# Patient Record
Sex: Female | Born: 1935 | Race: White | Hispanic: No | Marital: Single | Smoking: Never smoker
Health system: Southern US, Community
[De-identification: ages and names within clinical notes are randomized; demographics above are authoritative.]

## PROBLEM LIST (undated history)

## (undated) DIAGNOSIS — L0390 Cellulitis, unspecified: Secondary | ICD-10-CM

## (undated) DIAGNOSIS — I10 Essential (primary) hypertension: Secondary | ICD-10-CM

## (undated) DIAGNOSIS — E119 Type 2 diabetes mellitus without complications: Secondary | ICD-10-CM

## (undated) DIAGNOSIS — E7800 Pure hypercholesterolemia, unspecified: Secondary | ICD-10-CM

## (undated) HISTORY — PX: TONSILLECTOMY: SUR1361

---

## 2022-02-08 ENCOUNTER — Encounter (HOSPITAL_BASED_OUTPATIENT_CLINIC_OR_DEPARTMENT_OTHER): Payer: Self-pay | Admitting: Emergency Medicine

## 2022-02-08 ENCOUNTER — Other Ambulatory Visit: Payer: Self-pay

## 2022-02-08 ENCOUNTER — Emergency Department (HOSPITAL_BASED_OUTPATIENT_CLINIC_OR_DEPARTMENT_OTHER)
Admission: EM | Admit: 2022-02-08 | Discharge: 2022-02-08 | Disposition: A | Discharge status: Home / Self Care | Payer: Medicare Other | Attending: Emergency Medicine | Admitting: Emergency Medicine

## 2022-02-08 ENCOUNTER — Emergency Department (HOSPITAL_BASED_OUTPATIENT_CLINIC_OR_DEPARTMENT_OTHER): Payer: Medicare Other

## 2022-02-08 DIAGNOSIS — S32010A Wedge compression fracture of first lumbar vertebra, initial encounter for closed fracture: Secondary | ICD-10-CM

## 2022-02-08 DIAGNOSIS — W109XXA Fall (on) (from) unspecified stairs and steps, initial encounter: Secondary | ICD-10-CM | POA: Diagnosis not present

## 2022-02-08 DIAGNOSIS — Z79899 Other long term (current) drug therapy: Secondary | ICD-10-CM | POA: Insufficient documentation

## 2022-02-08 DIAGNOSIS — S3992XA Unspecified injury of lower back, initial encounter: Secondary | ICD-10-CM | POA: Diagnosis present

## 2022-02-08 DIAGNOSIS — Z7982 Long term (current) use of aspirin: Secondary | ICD-10-CM | POA: Insufficient documentation

## 2022-02-08 HISTORY — DX: Pure hypercholesterolemia, unspecified: E78.00

## 2022-02-08 HISTORY — DX: Essential (primary) hypertension: I10

## 2022-02-08 HISTORY — DX: Type 2 diabetes mellitus without complications: E11.9

## 2022-02-08 HISTORY — DX: Cellulitis, unspecified: L03.90

## 2022-02-08 MED ORDER — LIDOCAINE 5 % EX PTCH
1.0000 | MEDICATED_PATCH | CUTANEOUS | Status: DC
  Administered 2022-02-08: 1 via TRANSDERMAL
  Filled 2022-02-08: qty 1

## 2022-02-08 MED ORDER — OXYCODONE HCL 5 MG PO TABS: 5.0000 mg | ORAL_TABLET | Freq: Four times a day (QID) | ORAL | 0 refills | Status: AC | PRN

## 2022-02-08 MED ORDER — IBUPROFEN 800 MG PO TABS
800.0000 mg | ORAL_TABLET | Freq: Once | ORAL | Status: AC
  Administered 2022-02-08: 800 mg via ORAL
  Filled 2022-02-08: qty 1

## 2022-02-08 MED ORDER — IBUPROFEN 600 MG PO TABS: 600.0000 mg | ORAL_TABLET | Freq: Four times a day (QID) | ORAL | 0 refills | Status: AC | PRN

## 2022-02-08 MED ORDER — FENTANYL CITRATE PF 50 MCG/ML IJ SOSY
50.0000 ug | PREFILLED_SYRINGE | Freq: Once | INTRAMUSCULAR | Status: DC
  Filled 2022-02-08: qty 1

## 2022-02-08 NOTE — ED Provider Notes (Signed)
?[facility] ?Provider Note ? ? ?CSN: [id number] ?Arrival date & time: 02/08/22  1244 ? ?  ? ?History ? ?Chief Complaint  ?Patient presents with  ? Fall  ? ? ?Heather Burch is a 88 y.o. female. ? ?Patient is a 88 yo female presenting for back pain after fall. Pt states she was walking down steps when she slipped and fell 2 steps 2 days ago, on Saturday 01/06/22. Admits to head trauma, no loc, no blood thinner use. States she had immediate lower midline back pain that she thought would get better over time. States pain is not improving with home motrin use. Denies any bowel or urinary incontinence. Denies any sensation or motor deficits.  ? ?The history is provided by the patient and a friend. No language interpreter was used.  ?Fall ?Pertinent negatives include no chest pain, no abdominal pain and no shortness of breath.  ? ?  ? ?Home Medications ?Prior to Admission medications   ?Medication Sig Start Date End Date Taking? Authorizing Provider  ?amLODipine (NORVASC) 5 MG tablet Take 5 mg by mouth daily.   Yes [provider]  ?aspirin 81 MG chewable tablet Chew by mouth daily.   Yes [provider]  ?losartan (COZAAR) 100 MG tablet Take 50 mg by mouth daily.   Yes [provider]  ?metFORMIN (GLUCOPHAGE) 500 MG tablet Take by mouth 2 (two) times daily with a meal.   Yes [provider]  ?simvastatin (ZOCOR) 10 MG tablet Take 10 mg by mouth daily.   Yes [provider]  ?   ? ?Allergies    ?Patient has no allergy information on record.   ? ?Review of Systems   ?Review of Systems  ?Constitutional:  Negative for chills and fever.  ?HENT:  Negative for ear pain and sore throat.   ?Eyes:  Negative for pain and visual disturbance.  ?Respiratory:  Negative for cough and shortness of breath.   ?Cardiovascular:  Negative for chest pain and palpitations.  ?Gastrointestinal:  Negative for abdominal pain and vomiting.  ?Genitourinary:  Negative for dysuria  and hematuria.  ?Musculoskeletal:  Positive for back pain. Negative for arthralgias.  ?Skin:  Negative for color change and rash.  ?Neurological:  Negative for seizures and syncope.  ?All other systems reviewed and are negative. ? ?Physical Exam ?Updated Vital Signs ?BP (!) 168/68 (BP Location: Left Arm)   Pulse 81   Temp 98.1 ?F (36.7 ?C) (Oral)   Resp 18   Ht 5\' 6"  (1.676 m)   Wt 77.1 kg   SpO2 96%   BMI 27.44 kg/m?  ?Physical Exam ?Vitals and nursing note reviewed.  ?Constitutional:   ?   General: She is not in acute distress. ?   Appearance: She is well-developed.  ?HENT:  ?   Head: Normocephalic and atraumatic.  ?Eyes:  ?   Conjunctiva/sclera: Conjunctivae normal.  ?Cardiovascular:  ?   Rate and Rhythm: Normal rate and regular rhythm.  ?   Heart sounds: No murmur heard. ?Pulmonary:  ?   Effort: Pulmonary effort is normal. No respiratory distress.  ?   Breath sounds: Normal breath sounds.  ?Abdominal:  ?   Palpations: Abdomen is soft.  ?   Tenderness: There is no abdominal tenderness.  ?Musculoskeletal:     ?   General: No swelling.  ?   Cervical back: Neck supple. No bony tenderness.  ?   Thoracic back: No bony tenderness.  ?   Lumbar back: Tenderness and  bony tenderness present.  ?Skin: ?   General: Skin is warm and dry.  ?   Capillary Refill: Capillary refill takes less than 2 seconds.  ?Neurological:  ?   Mental Status: She is alert.  ?   GCS: GCS eye subscore is 4. GCS verbal subscore is 5. GCS motor subscore is 6.  ?   Sensory: Sensation is intact.  ?   Motor: Motor function is intact.  ?Psychiatric:     ?   Mood and Affect: Mood normal.  ? ? ?ED Results / Procedures / Treatments   ?Labs ?(all labs ordered are listed, but only abnormal results are displayed) ?Labs Reviewed - No data to display ? ?EKG ?None ? ?Radiology ?CT Lumbar Spine Wo Contrast ? ?Result Date: 02/08/2022 ?CLINICAL DATA:  Back trauma, patient states she fell down the steps today, approximately 5-6-. EXAM: CT LUMBAR SPINE WITHOUT  CONTRAST TECHNIQUE: Multidetector CT imaging of the lumbar spine was performed without intravenous contrast administration. Multiplanar CT image reconstructions were also generated. RADIATION DOSE REDUCTION: This exam was performed according to the departmental dose-optimization program which includes automated exposure control, adjustment of the mA and/or kV according to patient size and/or use of iterative reconstruction technique. COMPARISON:  None. FINDINGS: Segmentation: 5 lumbar type vertebrae. Alignment: Grade 1 retrolisthesis of L3 and anterolisthesis of L4. Vertebrae: Superior endplate compression deformity of the L1 vertebral body with approximately 10% anterior vertebral body height loss. Paraspinal and other soft tissues: Negative. Disc levels: T12-L1: No significant spinal canal or neural foraminal stenosis. L1-L2: No significant spinal canal or neural foraminal stenosis. L2-L3: Disc height loss and vacuum disc phenomena. Disc osteophyte complex with moderate narrowing of spinal canal. Narrowing of bilateral lateral recesses. No significant neural foraminal stenosis. L3-L4: Disc osteophyte complex with moderate spinal canal stenosis. Moderate bilateral neural foraminal stenosis, right greater than the left. L4-L5: Disc protrusion with moderate narrowing of spinal canal. Moderate facet joint arthropathy. No significant neural foraminal stenosis. L5-S1: Disc osteophyte complex with narrowing of spinal canal. Moderate facet joint arthropathy. No significant neural foraminal stenosis. IMPRESSION: 1. Superior endplate compression deformity of L1 vertebral body with approximately 10% anterior vertebral body height loss, likely an acute process. 2. Straightening of the lumbar spine with retrolisthesis of L3 and anterolisthesis of L4 with associated facet joint arthropathy. 3.  Advanced multilevel degenerative disc disease as detailed above. Electronically Signed   By: Larose Hires D.O.   On: 02/08/2022 16:27    ? ?Procedures ?Procedures  ? ? ?Medications Ordered in ED ?Medications - No data to display ? ?ED Course/ Medical Decision Making/ A&P ?  ?                        ?Medical Decision Making ?Amount and/or Complexity of Data Reviewed ?Radiology: ordered. ? ?Risk ?Prescription drug management. ? ? ?4:35 PM ?88 yo female presenting for back pain after fall 2 days ago.  She is alert and oriented x3, no acute distress, afebrile, stable vital signs.  Patient neurovascularly intact.  CT lumbar spine demonstrates L1 compression fracture with greater than 10% height loss.  Patient given medication for pain control and close follow-up with specialist. ? ?Patient in no distress and overall condition improved here in the ED. Detailed discussions were had with the patient regarding current findings, and need for close f/u with PCP or on call doctor. The patient has been instructed to return immediately if the symptoms worsen in any way for re-evaluation. Patient  verbalized understanding and is in agreement with current care plan. All questions answered prior to discharge. ? ? ? ? ? ? ? ? ?Final Clinical Impression(s) / ED Diagnoses ?Final diagnoses:  ?Compression fracture of L1 vertebra, initial encounter (HCC)  ? ? ?Rx / DC Orders ?ED Discharge Orders   ? ? None  ? ?  ? ? ?  ?Franne FortsGray, Ski Polich P, DO ?02/08/22 1645 ? ?

## 2022-02-08 NOTE — ED Triage Notes (Addendum)
Pt fell down 1-2 stairs on Saturday and c/o midline lower back pain since.  Pt reports she also hit her head and feels a slight bump on her posterior skull - denies any headache, n/v, dizziness or other symptoms at this time. ?

## 2022-02-08 NOTE — Discharge Instructions (Signed)
Office is closed at this time. Please call first thing tomorrow morning to establish an appointment.  ?

## 2023-04-24 IMAGING — CT CT L SPINE W/O CM
3 of 4 series · 13 of 33 positions shown, 14 images · non-contrast
Comparison: None.

CLINICAL DATA: Back trauma, patient states she fell down the steps
today, approximately 5-6-.



[Series 4: l-spine wo soft tissue · axial · 0.34mm/px · z∈[+820,+1054]mm · 7 of 157 slices shown]
[im 20/157  soft-tissue]
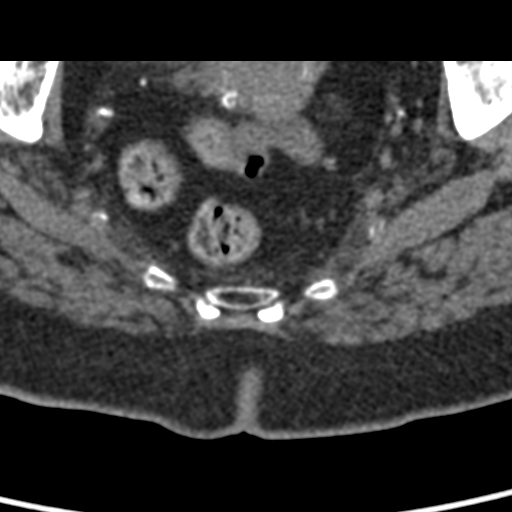
[im 40/157  soft-tissue]
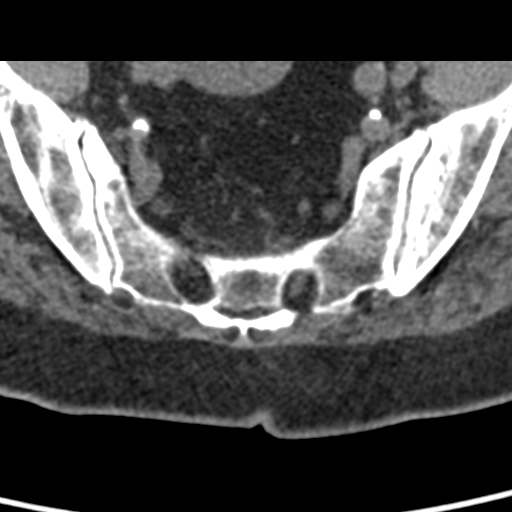
[im 59/157  soft-tissue]
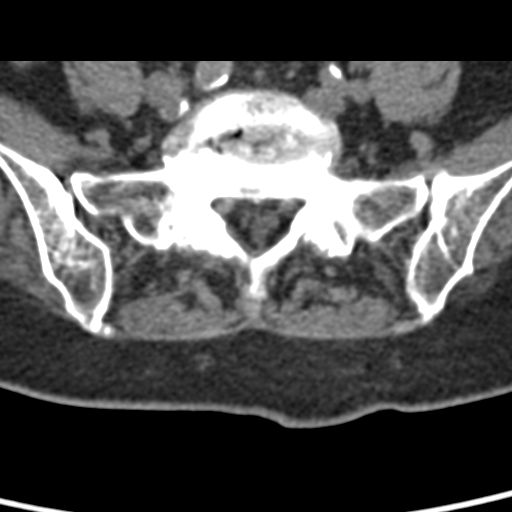
[im 79/157  soft-tissue]
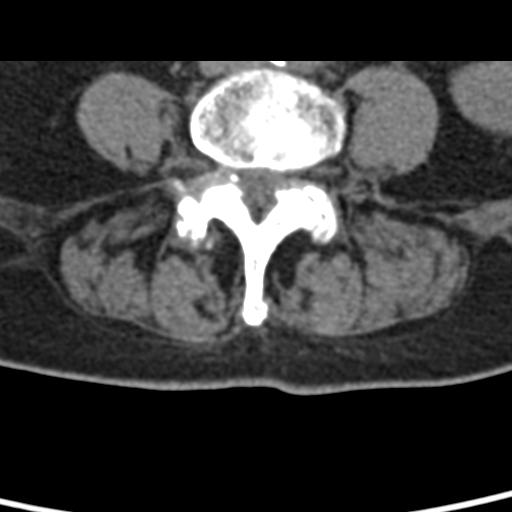
[im 98/157  soft-tissue]
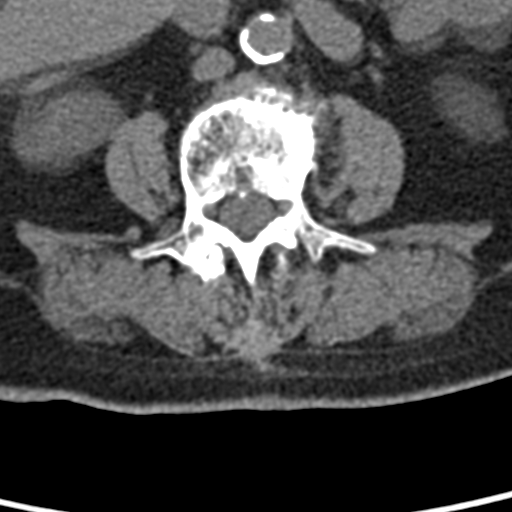
[im 118/157  soft-tissue]
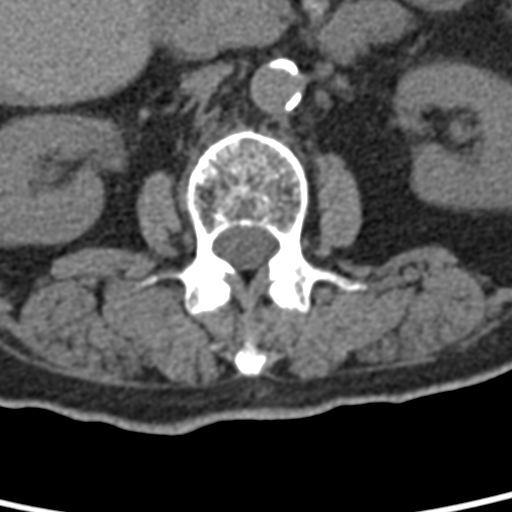
[im 137/157  soft-tissue]
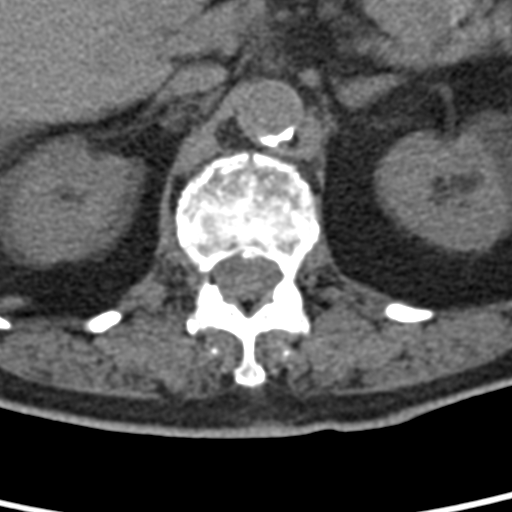

[Series 5: coronal bone · coronal · 0.32mm/px · 3 of 70 slices shown]
[im 14/70  bone]
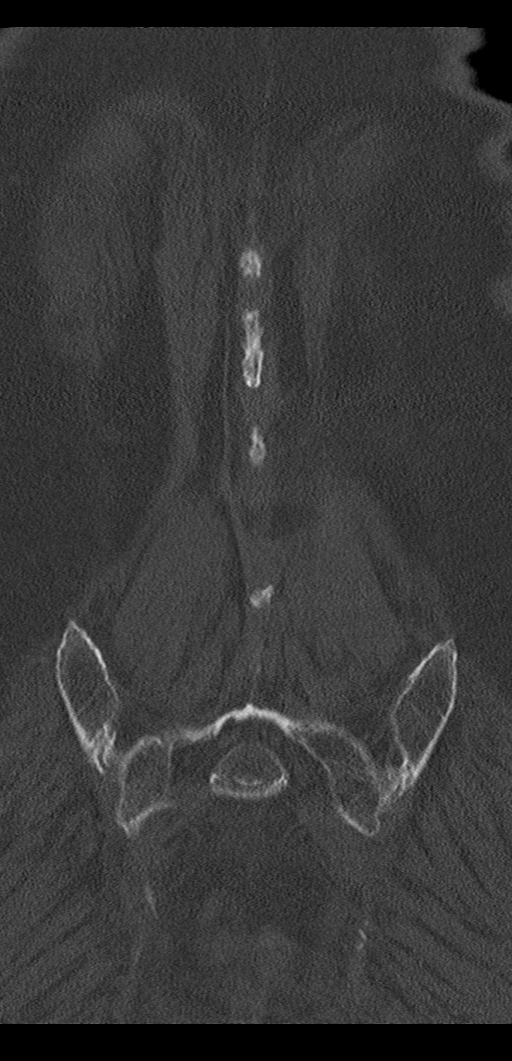
[im 28/70  bone]
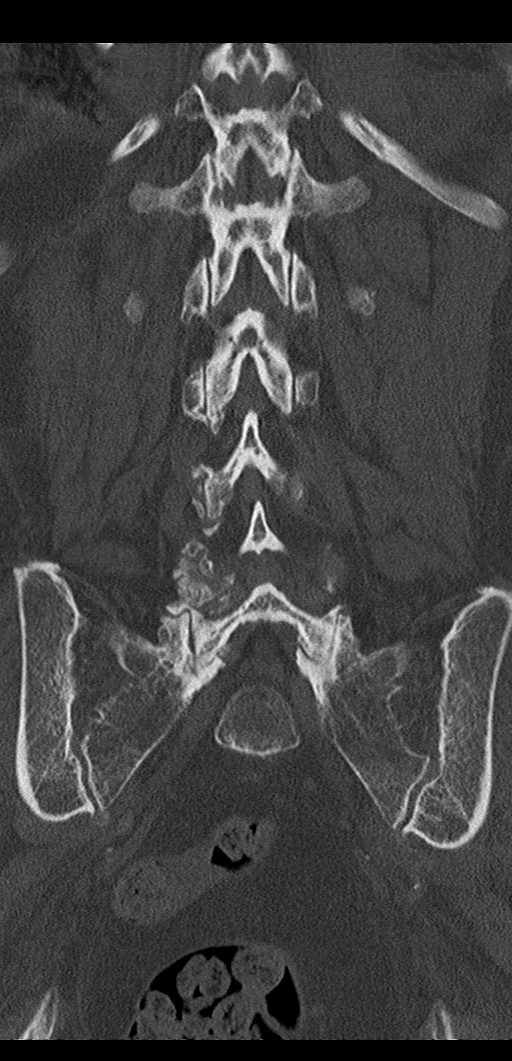
[im 42/70  bone]
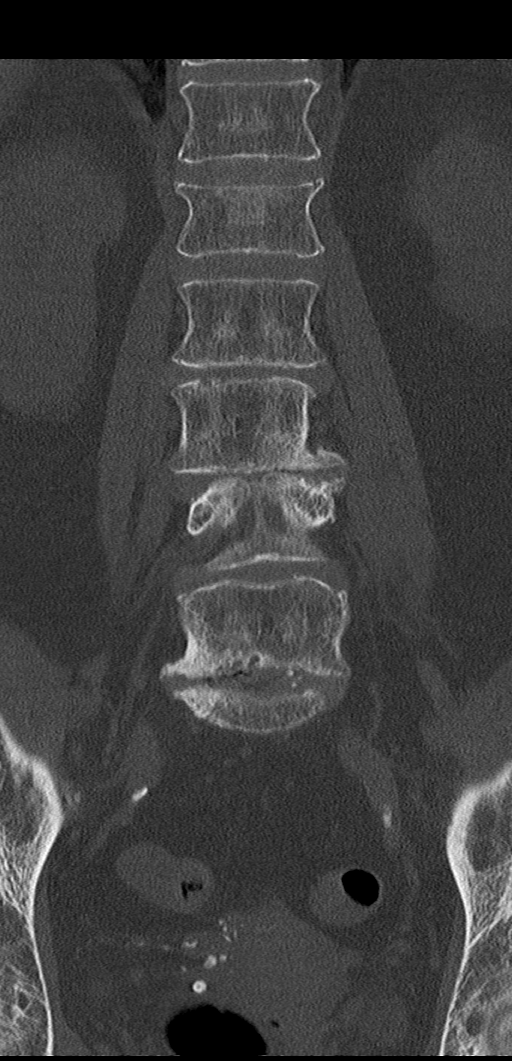

[Series 8: ax disc · axial · 0.23mm/px · z∈[+930,+1044]mm · 3 of 99 slices shown, 4 images]
[im 25/99  soft-tissue]
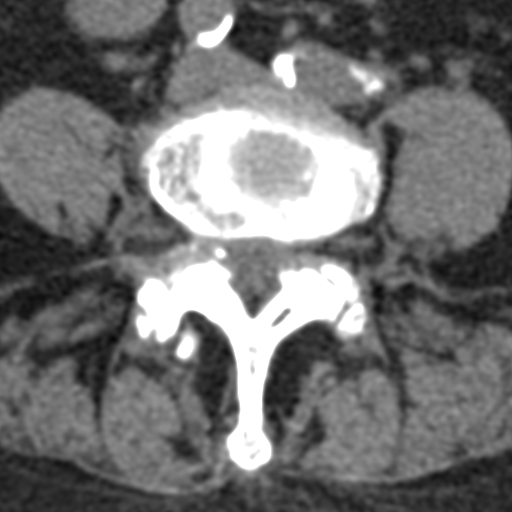
[im 25/99  bone]
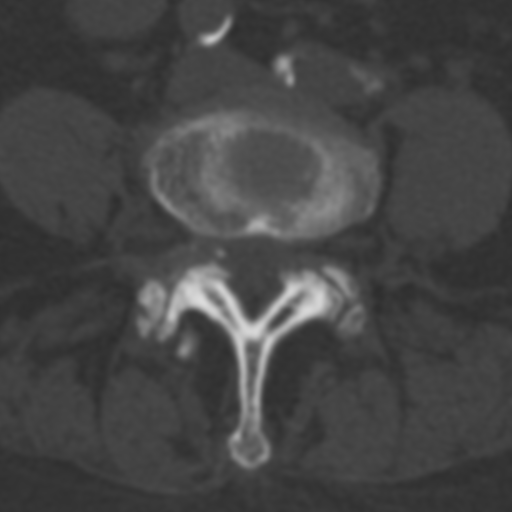
[im 50/99  bone]
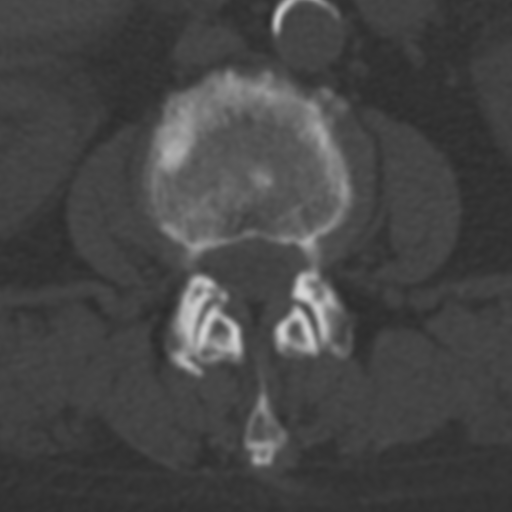
[im 74/99  bone]
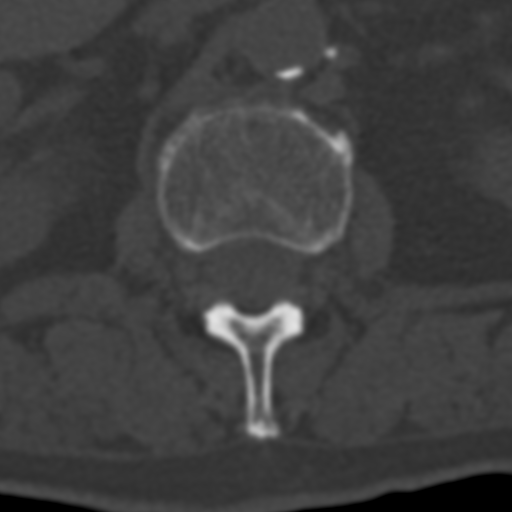

[13 of 33 positions shown; findings below may reference images not displayed]

FINDINGS: Segmentation: 5 lumbar type vertebrae.

Alignment: Grade 1 retrolisthesis of L3 and anterolisthesis of L4.

Vertebrae: Superior endplate compression deformity of the L1
vertebral body with approximately 10% anterior vertebral body height
loss.

Paraspinal and other soft tissues: Negative.

Disc levels:

T12-L1: No significant spinal canal or neural foraminal stenosis.

L1-L2: No significant spinal canal or neural foraminal stenosis.

L2-L3: Disc height loss and vacuum disc phenomena. Disc osteophyte
complex with moderate narrowing of spinal canal. Narrowing of
bilateral lateral recesses. No significant neural foraminal
stenosis.

L3-L4: Disc osteophyte complex with moderate spinal canal stenosis.
Moderate bilateral neural foraminal stenosis, right greater than the
left.

L4-L5: Disc protrusion with moderate narrowing of spinal canal.
Moderate facet joint arthropathy. No significant neural foraminal
stenosis.

L5-S1: Disc osteophyte complex with narrowing of spinal canal.
Moderate facet joint arthropathy. No significant neural foraminal
stenosis.
IMPRESSION: 1. Superior endplate compression deformity of L1 vertebral body with
approximately 10% anterior vertebral body height loss, likely an
acute process.

2. Straightening of the lumbar spine with retrolisthesis of L3 and
anterolisthesis of L4 with associated facet joint arthropathy.

3.  Advanced multilevel degenerative disc disease as detailed above.
